# Patient Record
Sex: Male | Born: 1990 | Hispanic: Yes | Marital: Single | Smoking: Never smoker
Health system: Southern US, Community
[De-identification: ages and names within clinical notes are randomized; demographics above are authoritative.]

## PROBLEM LIST (undated history)

## (undated) DIAGNOSIS — F419 Anxiety disorder, unspecified: Secondary | ICD-10-CM

## (undated) HISTORY — DX: Anxiety disorder, unspecified: F41.9

---

## 2012-11-17 ENCOUNTER — Inpatient Hospital Stay (HOSPITAL_COMMUNITY)
Admission: EM | Admit: 2012-11-17 | Discharge: 2012-11-18 | DRG: 088 | Disposition: A | Discharge status: Home / Self Care | Payer: Self-pay | Attending: General Surgery | Admitting: General Surgery

## 2012-11-17 ENCOUNTER — Encounter (HOSPITAL_COMMUNITY): Payer: Self-pay | Admitting: Radiology

## 2012-11-17 ENCOUNTER — Emergency Department (HOSPITAL_COMMUNITY): Payer: Self-pay

## 2012-11-17 DIAGNOSIS — E876 Hypokalemia: Secondary | ICD-10-CM | POA: Diagnosis present

## 2012-11-17 DIAGNOSIS — R4182 Altered mental status, unspecified: Secondary | ICD-10-CM

## 2012-11-17 DIAGNOSIS — F10929 Alcohol use, unspecified with intoxication, unspecified: Secondary | ICD-10-CM | POA: Diagnosis present

## 2012-11-17 DIAGNOSIS — S060X9A Concussion with loss of consciousness of unspecified duration, initial encounter: Secondary | ICD-10-CM

## 2012-11-17 DIAGNOSIS — S069X0A Unspecified intracranial injury without loss of consciousness, initial encounter: Secondary | ICD-10-CM

## 2012-11-17 DIAGNOSIS — S0100XA Unspecified open wound of scalp, initial encounter: Secondary | ICD-10-CM | POA: Diagnosis present

## 2012-11-17 DIAGNOSIS — S060X0A Concussion without loss of consciousness, initial encounter: Principal | ICD-10-CM | POA: Diagnosis present

## 2012-11-17 DIAGNOSIS — F1010 Alcohol abuse, uncomplicated: Secondary | ICD-10-CM | POA: Diagnosis present

## 2012-11-17 DIAGNOSIS — S0003XA Contusion of scalp, initial encounter: Secondary | ICD-10-CM | POA: Diagnosis present

## 2012-11-17 DIAGNOSIS — S0101XA Laceration without foreign body of scalp, initial encounter: Secondary | ICD-10-CM

## 2012-11-17 DIAGNOSIS — S069X9A Unspecified intracranial injury with loss of consciousness of unspecified duration, initial encounter: Secondary | ICD-10-CM

## 2012-11-17 DIAGNOSIS — J9600 Acute respiratory failure, unspecified whether with hypoxia or hypercapnia: Secondary | ICD-10-CM | POA: Diagnosis present

## 2012-11-17 LAB — PROTIME-INR
INR: 1.17 (ref 0.00–1.49)
Prothrombin Time: 14.7 seconds (ref 11.6–15.2)

## 2012-11-17 LAB — CBC
HCT: 43.4 % (ref 39.0–52.0)
HCT: 38.3 % — ABNORMAL LOW (ref 39.0–52.0)
MCH: 31.1 pg (ref 26.0–34.0)
MCHC: 34.8 g/dL (ref 30.0–36.0)
MCHC: 34.5 g/dL (ref 30.0–36.0)
Platelets: 211 10*3/uL (ref 150–400)
Platelets: 186 10*3/uL (ref 150–400)
RDW: 13.1 % (ref 11.5–15.5)
RDW: 13.1 % (ref 11.5–15.5)
WBC: 11.9 10*3/uL — ABNORMAL HIGH (ref 4.0–10.5)

## 2012-11-17 LAB — BASIC METABOLIC PANEL
BUN: 8 mg/dL (ref 6–23)
Creatinine, Ser: 0.61 mg/dL (ref 0.50–1.35)
GFR calc Af Amer: >90 mL/min (ref >90)
GFR calc non Af Amer: >90 mL/min (ref >90)
Potassium: 3.1 mEq/L — ABNORMAL LOW (ref 3.5–5.1)

## 2012-11-17 LAB — POCT I-STAT, CHEM 8
Chloride: 102 mEq/L (ref 96–112)
Creatinine, Ser: 1.3 mg/dL (ref 0.50–1.35)
Glucose, Bld: 97 mg/dL (ref 70–99)
HCT: 47 % (ref 39.0–52.0)
Hemoglobin: 16 g/dL (ref 13.0–17.0)
Potassium: 3.1 mEq/L — ABNORMAL LOW (ref 3.5–5.1)
Sodium: 143 mEq/L (ref 135–145)
TCO2: 22 mmol/L (ref 0–100)

## 2012-11-17 LAB — POCT I-STAT 3, ART BLOOD GAS (G3+)
Acid-base deficit: 3 mmol/L — ABNORMAL HIGH (ref 0.0–2.0)
O2 Saturation: 100 %
pO2, Arterial: 561 mmHg — ABNORMAL HIGH (ref 80.0–100.0)

## 2012-11-17 LAB — COMPREHENSIVE METABOLIC PANEL
ALT: 32 U/L (ref 0–53)
Alkaline Phosphatase: 84 U/L (ref 39–117)
CO2: 24 mEq/L (ref 19–32)
Chloride: 101 mEq/L (ref 96–112)
Creatinine, Ser: 0.76 mg/dL (ref 0.50–1.35)
GFR calc Af Amer: >90 mL/min (ref >90)
GFR calc non Af Amer: >90 mL/min (ref >90)
Glucose, Bld: 104 mg/dL — ABNORMAL HIGH (ref 70–99)
Potassium: 3 mEq/L — ABNORMAL LOW (ref 3.5–5.1)
Sodium: 138 mEq/L (ref 135–145)
Total Bilirubin: 0.4 mg/dL (ref 0.3–1.2)

## 2012-11-17 LAB — RAPID URINE DRUG SCREEN, HOSP PERFORMED
Amphetamines: NOT DETECTED
Barbiturates: NOT DETECTED
Benzodiazepines: NOT DETECTED
Opiates: NOT DETECTED
Tetrahydrocannabinol: NOT DETECTED

## 2012-11-17 LAB — MRSA PCR SCREENING: MRSA by PCR: NEGATIVE

## 2012-11-17 MED ORDER — POTASSIUM CHLORIDE IN NACL 20-0.9 MEQ/L-% IV SOLN
INTRAVENOUS | Status: DC
  Administered 2012-11-17: 100 mL/h via INTRAVENOUS
  Filled 2012-11-17 (×5): qty 1000

## 2012-11-17 MED ORDER — DIAZEPAM 5 MG/ML IJ SOLN
5.0000 mg | Freq: Once | INTRAMUSCULAR | Status: AC
  Administered 2012-11-17: 5 mg via INTRAVENOUS

## 2012-11-17 MED ORDER — PROPOFOL 10 MG/ML IV EMUL
5.0000 ug/kg/min | Freq: Once | INTRAVENOUS | Status: AC
  Administered 2012-11-17 (×2): 10 ug/kg/min via INTRAVENOUS

## 2012-11-17 MED ORDER — THIAMINE HCL 100 MG/ML IJ SOLN
100.0000 mg | Freq: Once | INTRAMUSCULAR | Status: AC
  Administered 2012-11-17: 100 mg via INTRAVENOUS
  Filled 2012-11-17: qty 2

## 2012-11-17 MED ORDER — PANTOPRAZOLE SODIUM 40 MG PO TBEC
40.0000 mg | DELAYED_RELEASE_TABLET | Freq: Every day | ORAL | Status: DC
  Administered 2012-11-18: 40 mg via ORAL
  Filled 2012-11-17: qty 1

## 2012-11-17 MED ORDER — ONDANSETRON HCL 4 MG/2ML IJ SOLN
4.0000 mg | Freq: Four times a day (QID) | INTRAMUSCULAR | Status: DC | PRN
  Administered 2012-11-17: 4 mg via INTRAVENOUS
  Filled 2012-11-17: qty 2

## 2012-11-17 MED ORDER — CHLORHEXIDINE GLUCONATE 0.12 % MT SOLN
15.0000 mL | Freq: Two times a day (BID) | OROMUCOSAL | Status: DC
  Administered 2012-11-17 – 2012-11-18 (×3): 15 mL via OROMUCOSAL
  Filled 2012-11-17 (×3): qty 15

## 2012-11-17 MED ORDER — SODIUM CHLORIDE 0.9 % IV SOLN
40.0000 ug/h | INTRAVENOUS | Status: DC
  Administered 2012-11-17: 40 ug/h via INTRAVENOUS
  Filled 2012-11-17: qty 50

## 2012-11-17 MED ORDER — ONDANSETRON HCL 4 MG PO TABS: 4.0000 mg | ORAL_TABLET | Freq: Four times a day (QID) | ORAL | Status: DC | PRN

## 2012-11-17 MED ORDER — IOHEXOL 300 MG/ML  SOLN
100.0000 mL | Freq: Once | INTRAMUSCULAR | Status: AC | PRN
  Administered 2012-11-17: 100 mL via INTRAVENOUS

## 2012-11-17 MED ORDER — POTASSIUM CHLORIDE 10 MEQ/100ML IV SOLN
10.0000 meq | INTRAVENOUS | Status: AC
  Administered 2012-11-17 (×3): 10 meq via INTRAVENOUS
  Filled 2012-11-17 (×3): qty 100

## 2012-11-17 MED ORDER — ETOMIDATE 2 MG/ML IV SOLN
INTRAVENOUS | Status: AC | PRN
  Administered 2012-11-17: 20 mg via INTRAVENOUS

## 2012-11-17 MED ORDER — SUCCINYLCHOLINE CHLORIDE 20 MG/ML IJ SOLN
INTRAMUSCULAR | Status: AC | PRN
  Administered 2012-11-17: 120 mg via INTRAVENOUS

## 2012-11-17 MED ORDER — SODIUM CHLORIDE 0.9 % IV SOLN
2.0000 mg/h | INTRAVENOUS | Status: DC
  Administered 2012-11-17: 10 mg/h via INTRAVENOUS
  Administered 2012-11-17: 2 mg/h via INTRAVENOUS
  Filled 2012-11-17 (×2): qty 10

## 2012-11-17 MED ORDER — BIOTENE DRY MOUTH MT LIQD
15.0000 mL | Freq: Four times a day (QID) | OROMUCOSAL | Status: DC
  Administered 2012-11-17 – 2012-11-18 (×5): 15 mL via OROMUCOSAL

## 2012-11-17 MED ORDER — METOPROLOL TARTRATE 1 MG/ML IV SOLN: 5.0000 mg | INTRAVENOUS | Status: DC | PRN

## 2012-11-17 MED ORDER — THIAMINE HCL 100 MG/ML IJ SOLN
Freq: Once | INTRAVENOUS | Status: AC
  Administered 2012-11-17: 04:00:00 via INTRAVENOUS
  Filled 2012-11-17: qty 1000

## 2012-11-17 MED ORDER — MIDAZOLAM HCL 5 MG/5ML IJ SOLN: INTRAMUSCULAR | Status: AC | PRN

## 2012-11-17 MED ORDER — SODIUM CHLORIDE 0.9 % IV SOLN: Freq: Once | INTRAVENOUS | Status: DC

## 2012-11-17 MED ORDER — OXYCODONE HCL 5 MG PO TABS
5.0000 mg | ORAL_TABLET | ORAL | Status: DC | PRN
  Filled 2012-11-17: qty 1

## 2012-11-17 MED ORDER — PANTOPRAZOLE SODIUM 40 MG IV SOLR
40.0000 mg | Freq: Every day | INTRAVENOUS | Status: DC
  Administered 2012-11-17: 40 mg via INTRAVENOUS
  Filled 2012-11-17 (×2): qty 40
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
## 2012-11-17 NOTE — Progress Notes (Signed)
UR completed.  Jaryn Hocutt, RN BSN M[facility] CCM Trauma/Neuro ICU Case Manager [phone]  
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
## 2012-11-17 NOTE — Progress Notes (Signed)
Patient transported on vent to room 3M11C without incident.

## 2012-11-17 NOTE — Procedures (Signed)
Extubation Procedure Note  Patient Details:   Name: [name] DOB: 04/02/1990 MRN: [id number]   Airway Documentation:     Evaluation  O2 sats: stable throughout Complications: No apparent complications Patient did tolerate procedure well. Bilateral Breath Sounds: Clear   Yes  Kirandeep Fariss M 11/17/2012, 11:50 AM  Pt extubated, breath sounds clear. No stridor. Pt able to vocalize name and date. Vitals are stable. Oxygen 2 LPM saturation 100%.

## 2012-11-17 NOTE — Progress Notes (Signed)
Patient transported on ventilator to CT and returned without incident.
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
## 2012-11-17 NOTE — ED Notes (Signed)
Patient to CT. Greig Castilla, RN and Dr. Andrey Campanile at bedside.
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
## 2012-11-17 NOTE — ED Notes (Signed)
Patient arrives via [facility], Dr. Andrey Campanile and Dr. Arnoldo Morale at bedside.
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
## 2012-11-18 ENCOUNTER — Encounter (HOSPITAL_COMMUNITY): Payer: Self-pay

## 2012-11-18 DIAGNOSIS — S060XAA Concussion with loss of consciousness status unknown, initial encounter: Secondary | ICD-10-CM

## 2012-11-18 DIAGNOSIS — S060X9A Concussion with loss of consciousness of unspecified duration, initial encounter: Secondary | ICD-10-CM

## 2012-11-18 DIAGNOSIS — S0101XA Laceration without foreign body of scalp, initial encounter: Secondary | ICD-10-CM

## 2012-11-18 DIAGNOSIS — F10929 Alcohol use, unspecified with intoxication, unspecified: Secondary | ICD-10-CM | POA: Diagnosis present

## 2012-11-18 DIAGNOSIS — J9600 Acute respiratory failure, unspecified whether with hypoxia or hypercapnia: Secondary | ICD-10-CM | POA: Diagnosis present

## 2012-11-18 LAB — BASIC METABOLIC PANEL
BUN: 6 mg/dL (ref 6–23)
CO2: 20 mEq/L (ref 19–32)
Calcium: 8.8 mg/dL (ref 8.4–10.5)
GFR calc non Af Amer: >90 mL/min (ref >90)
Glucose, Bld: 85 mg/dL (ref 70–99)
Potassium: 3.8 mEq/L (ref 3.5–5.1)
Sodium: 136 mEq/L (ref 135–145)

## 2012-11-18 LAB — SAMPLE TO BLOOD BANK

## 2012-11-18 LAB — URINE CULTURE
Colony Count: NO GROWTH
Culture: NO GROWTH

## 2012-11-18 LAB — CBC
Hemoglobin: 13.1 g/dL (ref 13.0–17.0)
MCH: 29.8 pg (ref 26.0–34.0)
MCHC: 33.2 g/dL (ref 30.0–36.0)
Platelets: 162 10*3/uL (ref 150–400)
RBC: 4.4 MIL/uL (ref 4.22–5.81)
WBC: 9.9 10*3/uL (ref 4.0–10.5)

## 2012-11-18 MED ORDER — TRAMADOL HCL 50 MG PO TABS
50.0000 mg | ORAL_TABLET | Freq: Four times a day (QID) | ORAL | Status: DC | PRN
  Administered 2012-11-18: 50 mg via ORAL
  Filled 2012-11-18: qty 1

## 2012-11-18 MED ORDER — TRAMADOL HCL 50 MG PO TABS: 50.0000 mg | ORAL_TABLET | Freq: Four times a day (QID) | ORAL | Status: AC | PRN

## 2012-11-18 NOTE — Progress Notes (Signed)
Pt discharged to home with sister via wheelchair.  D/C instructions were given including RX.  No needs at this time.  Harlan Stains Maloy

## 2012-11-18 NOTE — Progress Notes (Signed)
Mild headache.  Should be able to go home today.  This patient has been seen and I agree with the findings and treatment plan.  Marta Lamas. Gae Bon, MD, FACS [phone] (pager) [phone] (direct pager) Trauma Surgeon
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
## 2012-11-18 NOTE — Progress Notes (Signed)
Versed 30 mg or 30 ml and Fentanyl 2250 mcg or wasted in sink. Wasting of both medications witnessed by Debbie G.,RN.
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
## 2012-11-25 ENCOUNTER — Ambulatory Visit (INDEPENDENT_AMBULATORY_CARE_PROVIDER_SITE_OTHER): Payer: Self-pay | Admitting: Orthopedic Surgery

## 2012-11-25 ENCOUNTER — Encounter (INDEPENDENT_AMBULATORY_CARE_PROVIDER_SITE_OTHER): Payer: Self-pay

## 2012-11-25 VITALS — BP 112/70 | HR 76 | Temp 98.6°F | Resp 15 | Ht 68.25 in | Wt 124.4 lb

## 2012-11-25 DIAGNOSIS — S0101XD Laceration without foreign body of scalp, subsequent encounter: Secondary | ICD-10-CM

## 2012-11-25 DIAGNOSIS — S060X9D Concussion with loss of consciousness of unspecified duration, subsequent encounter: Secondary | ICD-10-CM

## 2012-11-25 DIAGNOSIS — Z5189 Encounter for other specified aftercare: Secondary | ICD-10-CM
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
## 2014-07-31 IMAGING — CT CT HEAD W/O CM
3 of 5 series · 14 of 47 positions shown, 16 images · non-contrast
Comparison: None available at time of study interpretation.

CT HEAD

CLINICAL DATA: Level I trauma.

CT HEAD WITHOUT CONTRAST
CT CERVICAL SPINE WITHOUT CONTRAST
TECHNIQUE: Multidetector CT imaging of the head and cervical spine
was performed following the standard protocol without intravenous
contrast.  Multiplanar CT image reconstructions of the cervical
spine were also generated.

[Series 7: coronals · coronal · 0.37mm/px · 3 of 61 slices shown]
[im 21/61  brain]
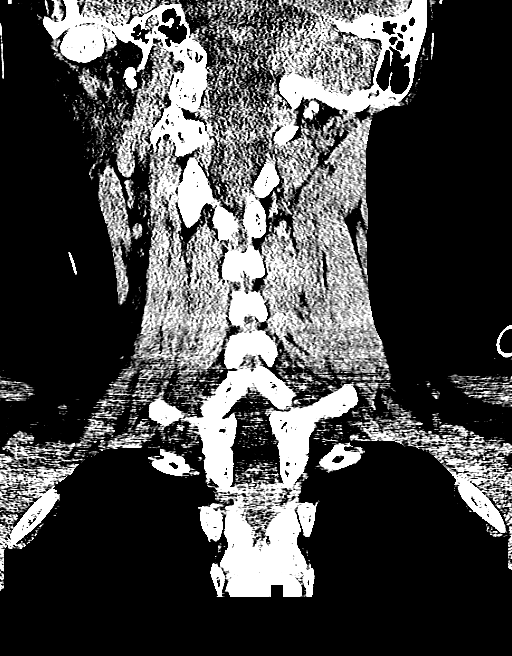
[im 27/61  brain]
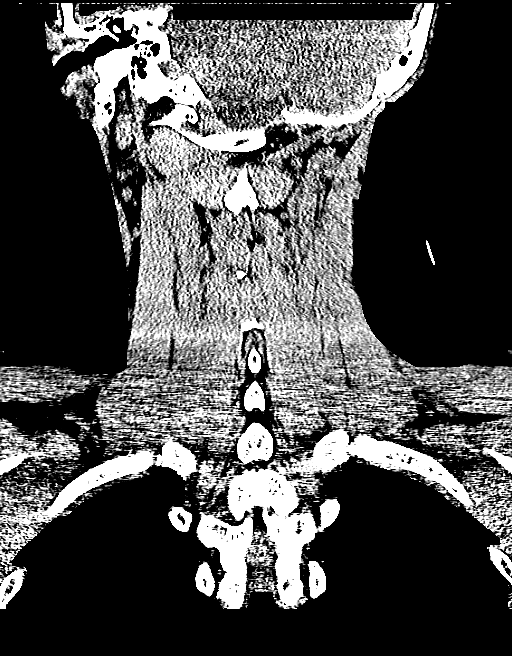
[im 34/61  brain]
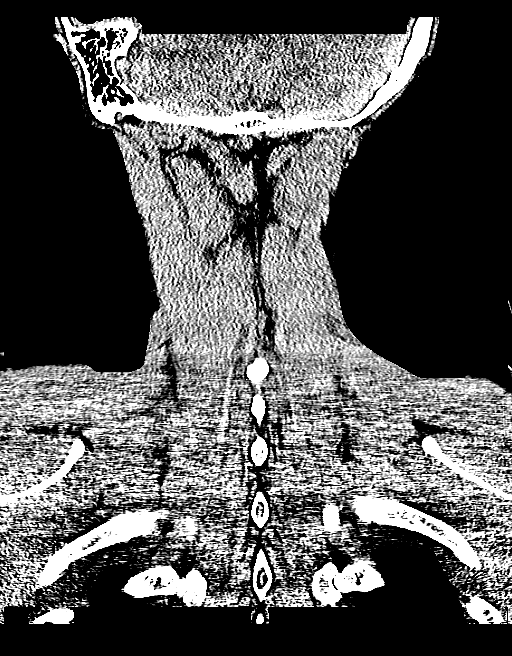

[Series 8: sagittals · sagittal · 0.34mm/px · 3 of 61 slices shown]
[im 21/61  brain]
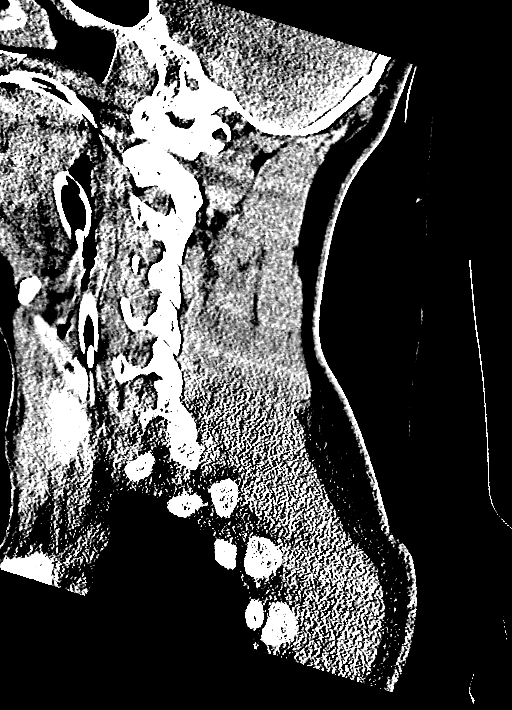
[im 31/61  brain]
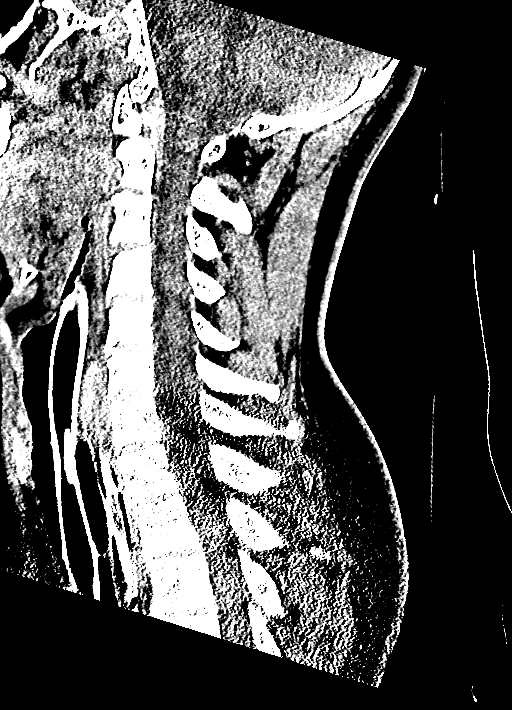
[im 41/61  brain]
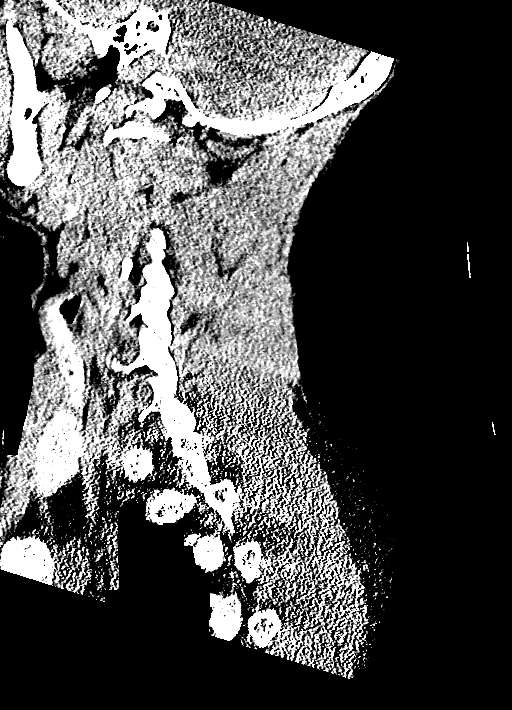

[Series 9: orthogonals · axial · 0.20mm/px · z∈[-64,+93]mm · 8 of 102 slices shown, 10 images]
[im 9/102  brain]
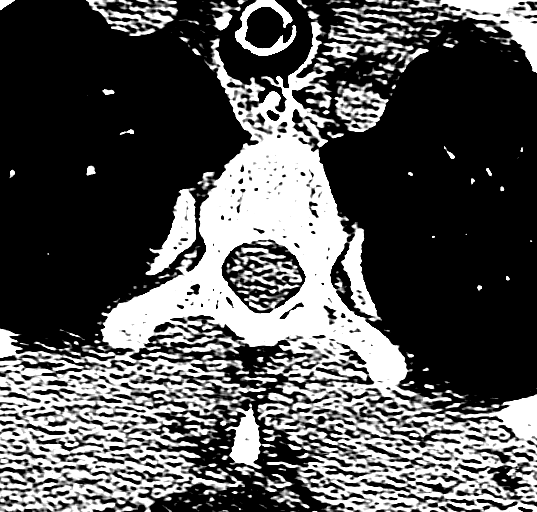
[im 9/102  bone]
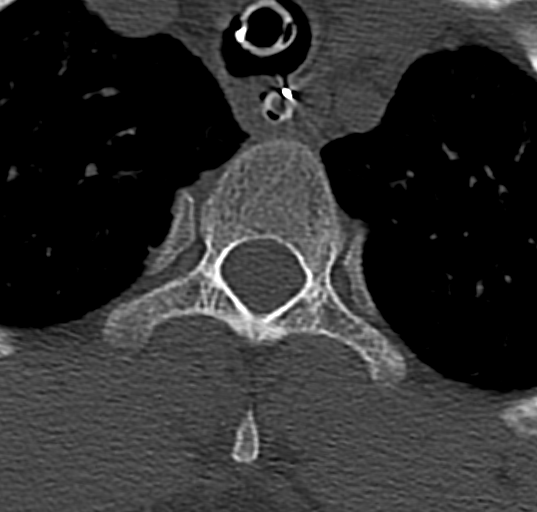
[im 26/102  brain]
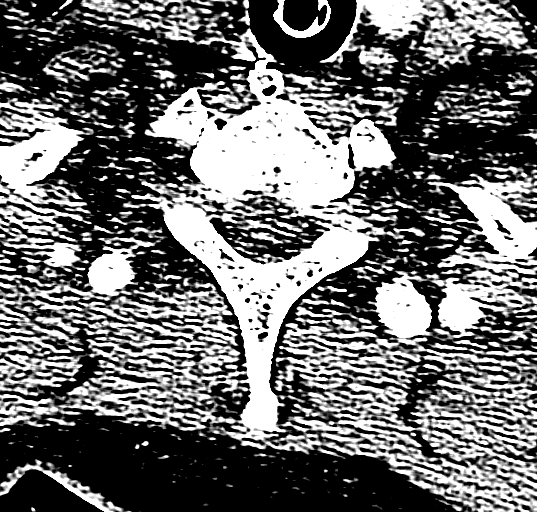
[im 34/102  brain]
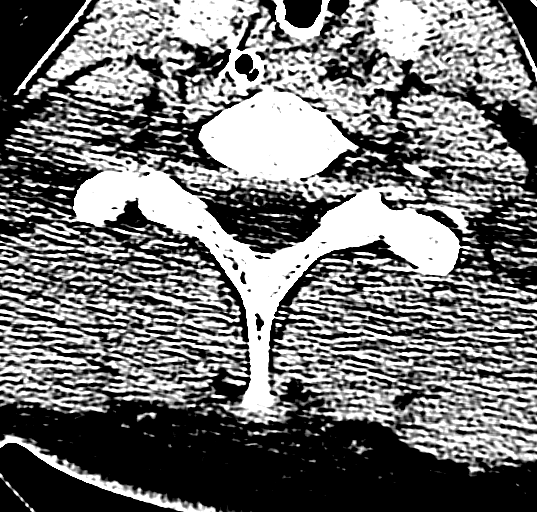
[im 43/102  brain]
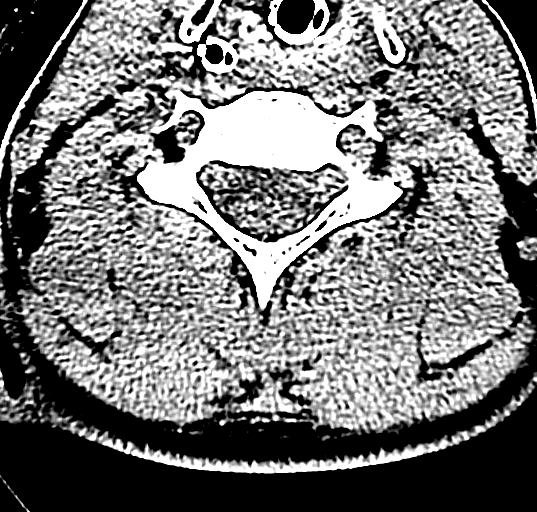
[im 59/102  brain]
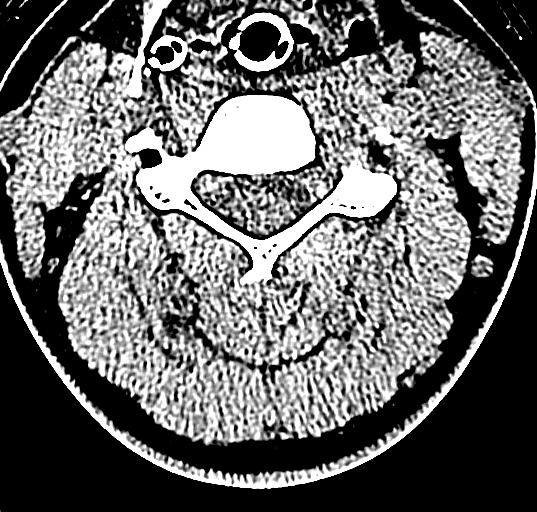
[im 59/102  bone]
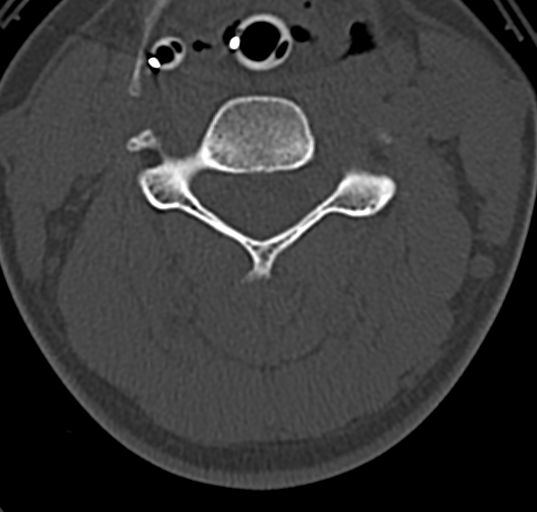
[im 68/102  brain]
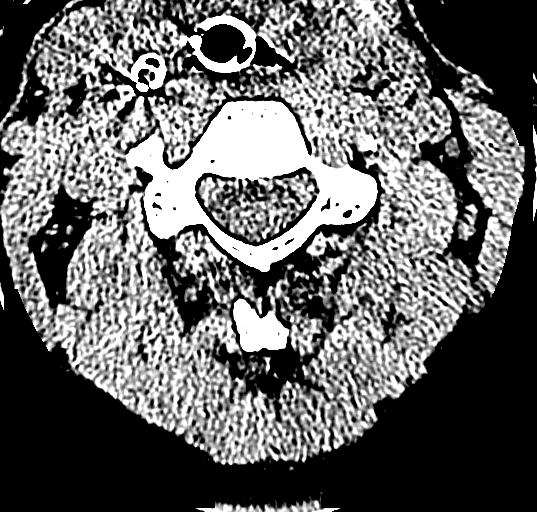
[im 76/102  brain]
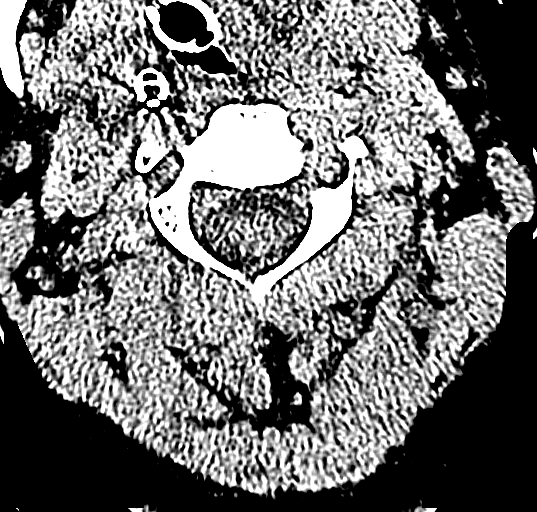
[im 93/102  brain]
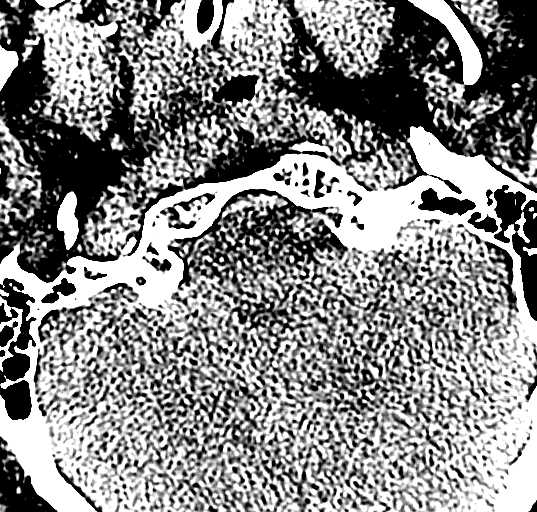

[14 of 47 positions shown; findings below may reference images not displayed]

FINDINGS: The ventricles and sulci are normal.  No intraparenchymal
hemorrhage, mass effect nor midline shift.  No acute large vascular
territory infarcts.

No abnormal extra-axial fluid collections.  Basal cisterns are
patent.

Small to moderate right frontal toe parietal scalp hematoma with
bubbles of gas consistent with laceration, overlying skin staples
but, no radiopaque foreign bodies.  Visualized paranasal sinuses
and mastoid aircells are well-aerated.  The included ocular globes
and orbital contents are non-suspicious.
IMPRESSION: Small to moderate right frontal parietal scalp hematoma without
underlying skull fracture and, no acute intracranial process;
normal noncontrast CT of the head.

CT CERVICAL SPINE
FINDINGS: Cervical vertebral bodies and posterior elements are
intact and aligned with maintenance of the cervical lordosis.
Intervertebral disc heights preserved.  No destructive bony
lesions.  Endotracheal tube, oral gastric tube in place, distal
tips not imaged.  Paraspinal soft tissues are not suspicious.

No osseous canal stenosis, nor neural foraminal narrowing.
IMPRESSION: No acute fracture or malalignment; normal noncontrast CT of the
cervical spine.  Life support lines in place.

## 2022-09-25 ENCOUNTER — Ambulatory Visit (INDEPENDENT_AMBULATORY_CARE_PROVIDER_SITE_OTHER): Payer: Self-pay | Admitting: Family Medicine

## 2022-09-25 ENCOUNTER — Encounter: Payer: Self-pay | Admitting: Family Medicine

## 2022-09-25 VITALS — BP 117/79 | HR 93 | Ht 69.0 in | Wt 155.0 lb

## 2022-09-25 DIAGNOSIS — Z114 Encounter for screening for human immunodeficiency virus [HIV]: Secondary | ICD-10-CM

## 2022-09-25 DIAGNOSIS — G8929 Other chronic pain: Secondary | ICD-10-CM

## 2022-09-25 DIAGNOSIS — M545 Low back pain, unspecified: Secondary | ICD-10-CM

## 2022-09-25 DIAGNOSIS — Z13220 Encounter for screening for lipoid disorders: Secondary | ICD-10-CM

## 2022-09-25 DIAGNOSIS — M549 Dorsalgia, unspecified: Secondary | ICD-10-CM | POA: Insufficient documentation

## 2022-09-25 DIAGNOSIS — Z1159 Encounter for screening for other viral diseases: Secondary | ICD-10-CM

## 2022-09-25 DIAGNOSIS — E559 Vitamin D deficiency, unspecified: Secondary | ICD-10-CM

## 2022-09-25 DIAGNOSIS — E538 Deficiency of other specified B group vitamins: Secondary | ICD-10-CM

## 2022-09-25 DIAGNOSIS — Z131 Encounter for screening for diabetes mellitus: Secondary | ICD-10-CM

## 2022-09-25 DIAGNOSIS — Z1329 Encounter for screening for other suspected endocrine disorder: Secondary | ICD-10-CM

## 2022-09-25 MED ORDER — METHOCARBAMOL 500 MG PO TABS: 500.0000 mg | ORAL_TABLET | Freq: Two times a day (BID) | ORAL | 3 refills | Status: AC | PRN

## 2022-09-25 NOTE — Assessment & Plan Note (Signed)
Robaxin 500 PRN Discussed medication desired effects, potential side effects. Non pharmacological interventions include rest, avoid twisting,improper bending, straining lower back. Demonstration of proper body mechanics. Alternate ice and heat. Recommend stretching back and legs. Follow up for worsening or persistent symptoms. Patient verbalizes understanding regarding plan of care and all questions answered.

## 2022-09-25 NOTE — Patient Instructions (Signed)

## 2022-09-25 NOTE — Progress Notes (Signed)
New Patient Office Visit   Subjective   Patient ID: Zachary Porter, male    DOB: 11-23-1990  Age: 32 y.o. MRN: [id number]  CC:  Chief Complaint  Patient presents with   Establish Care    Looking to start a wellness check.     HPI Zachary Porter 32 year old male, presents to establish care. He  has a past medical history of Anxiety.  Back Pain This is a new problem. The current episode started more than 1 month ago. The problem occurs constantly. The problem has been gradually worsening since onset. The pain is present in the lumbar spine. The quality of the pain is described as aching. The pain does not radiate. The pain is at a severity of 7/10. The pain is The same all the time. The symptoms are aggravated by lying down, position and standing. Pertinent negatives include no abdominal pain, chest pain, dysuria, leg pain, numbness or tingling. Risk factors include sedentary lifestyle and lack of exercise. He has tried analgesics for the symptoms. The treatment provided mild relief.      Outpatient Encounter Medications as of 09/25/2022  Medication Sig   methocarbamol (ROBAXIN) 500 MG tablet Take 1 tablet (500 mg total) by mouth every 12 (twelve) hours as needed for muscle spasms.   traMADol (ULTRAM) 50 MG tablet Take 1-2 tablets (50-100 mg total) by mouth every 6 (six) hours as needed (50mg  for mild pain, 75mg  for moderate pain, 100mg  for severe pain).   No facility-administered encounter medications on file as of 09/25/2022.    History reviewed. No pertinent surgical history.  Review of Systems  HENT:  Positive for tinnitus.   Eyes:  Negative for blurred vision.  Respiratory:  Negative for shortness of breath.   Cardiovascular:  Negative for chest pain.  Gastrointestinal:  Negative for abdominal pain.  Genitourinary:  Negative for dysuria.  Musculoskeletal:  Positive for back pain and myalgias.  Skin:  Negative for rash.  Neurological:  Negative for  dizziness, tingling and numbness.  Psychiatric/Behavioral:  Negative for depression. The patient is not nervous/anxious.       Objective    BP 117/79 (BP Location: Right Arm, Patient Position: Sitting, Cuff Size: Normal)   Pulse 93   Ht 5\' 9"  (1.753 m)   Wt 155 lb 0.6 oz (70.3 kg)   SpO2 97%   BMI 22.90 kg/m   Physical Exam Vitals reviewed.  Constitutional:      General: He is not in acute distress.    Appearance: Normal appearance. He is not ill-appearing, toxic-appearing or diaphoretic.  HENT:     Head: Normocephalic.  Eyes:     General:        Right eye: No discharge.        Left eye: No discharge.     Conjunctiva/sclera: Conjunctivae normal.  Cardiovascular:     Rate and Rhythm: Normal rate.     Pulses: Normal pulses.     Heart sounds: Normal heart sounds.  Pulmonary:     Effort: Pulmonary effort is normal. No respiratory distress.     Breath sounds: Normal breath sounds.  Abdominal:     General: Bowel sounds are normal.     Palpations: Abdomen is soft.     Tenderness: There is no abdominal tenderness. There is no right CVA tenderness, left CVA tenderness or guarding.  Musculoskeletal:     Cervical back: Normal range of motion.     Lumbar back: Decreased range  of motion. Negative right straight leg raise test and negative left straight leg raise test.  Skin:    General: Skin is warm and dry.     Capillary Refill: Capillary refill takes less than 2 seconds.  Neurological:     General: No focal deficit present.     Mental Status: He is alert and oriented to person, place, and time.     Coordination: Coordination normal.     Gait: Gait normal.  Psychiatric:        Mood and Affect: Mood normal.        Behavior: Behavior normal.        Thought Content: Thought content normal.        Judgment: Judgment normal.       Assessment & Plan:  Screening for diabetes mellitus -     Microalbumin / creatinine urine ratio -     Hemoglobin A1c  Screening for lipid  disorders -     Lipid panel -     CMP14+EGFR -     CBC with Differential/Platelet  Need for hepatitis C screening test -     Hepatitis C antibody  Vitamin D deficiency -     VITAMIN D 25 Hydroxy (Vit-D Deficiency, Fractures)  Screening for HIV (human immunodeficiency virus) -     HIV Antibody (routine testing w rflx)  Screening for thyroid disorder -     TSH + free T4  Vitamin B12 deficiency -     B12 and Folate Panel  Chronic low back pain without sciatica, unspecified back pain laterality Assessment & Plan: Robaxin 500 PRN Discussed medication desired effects, potential side effects. Non pharmacological interventions include rest, avoid twisting,improper bending, straining lower back. Demonstration of proper body mechanics. Alternate ice and heat. Recommend stretching back and legs. Follow up for worsening or persistent symptoms. Patient verbalizes understanding regarding plan of care and all questions answered.    Other orders -     Methocarbamol; Take 1 tablet (500 mg total) by mouth every 12 (twelve) hours as needed for muscle spasms.  Dispense: 30 tablet; Refill: 3    Return in 1 year (on 09/25/2023), or if symptoms worsen or fail to improve, for Annual Physical.   Cruzita Lederer Newman Nip, FNP

## 2022-09-28 LAB — CBC WITH DIFFERENTIAL/PLATELET
Basophils Absolute: 0.1 10*3/uL (ref 0.0–0.2)
Basos: 1 %
EOS (ABSOLUTE): 0.3 10*3/uL (ref 0.0–0.4)
Eos: 4 %
Hematocrit: 44 % (ref 37.5–51.0)
Hemoglobin: 14.7 g/dL (ref 13.0–17.7)
Immature Grans (Abs): 0 10*3/uL (ref 0.0–0.1)
Immature Granulocytes: 0 %
Lymphocytes Absolute: 2.5 10*3/uL (ref 0.7–3.1)
Lymphs: 39 %
MCH: 30.2 pg (ref 26.6–33.0)
MCHC: 33.4 g/dL (ref 31.5–35.7)
MCV: 91 fL (ref 79–97)
Monocytes Absolute: 0.4 10*3/uL (ref 0.1–0.9)
Monocytes: 6 %
Neutrophils Absolute: 3.2 10*3/uL (ref 1.4–7.0)
Neutrophils: 50 %
Platelets: 251 10*3/uL (ref 150–450)
RBC: 4.86 x10E6/uL (ref 4.14–5.80)
RDW: 12.9 % (ref 11.6–15.4)
WBC: 6.4 10*3/uL (ref 3.4–10.8)

## 2022-09-28 LAB — CMP14+EGFR
ALT: 29 IU/L (ref 0–44)
AST: 21 IU/L (ref 0–40)
Albumin: 4.8 g/dL (ref 4.1–5.1)
Alkaline Phosphatase: 98 IU/L (ref 44–121)
BUN/Creatinine Ratio: 11 (ref 9–20)
BUN: 10 mg/dL (ref 6–20)
Bilirubin Total: 0.5 mg/dL (ref 0.0–1.2)
CO2: 20 mmol/L (ref 20–29)
Calcium: 9.5 mg/dL (ref 8.7–10.2)
Chloride: 104 mmol/L (ref 96–106)
Creatinine, Ser: 0.92 mg/dL (ref 0.76–1.27)
Globulin, Total: 2.5 g/dL (ref 1.5–4.5)
Glucose: 100 mg/dL — ABNORMAL HIGH (ref 70–99)
Potassium: 4.2 mmol/L (ref 3.5–5.2)
Sodium: 140 mmol/L (ref 134–144)
Total Protein: 7.3 g/dL (ref 6.0–8.5)
eGFR: 112 mL/min/{1.73_m2} (ref >59)

## 2022-09-28 LAB — B12 AND FOLATE PANEL
Folate: 6.9 ng/mL (ref >3.0)
Vitamin B-12: 354 pg/mL (ref 232–1245)

## 2022-09-28 LAB — HEMOGLOBIN A1C
Est. average glucose Bld gHb Est-mCnc: 105 mg/dL
Hgb A1c MFr Bld: 5.3 % (ref 4.8–5.6)

## 2022-09-28 LAB — LIPID PANEL
Chol/HDL Ratio: 4.2 ratio (ref 0.0–5.0)
Cholesterol, Total: 150 mg/dL (ref 100–199)
HDL: 36 mg/dL — ABNORMAL LOW (ref >39)
LDL Chol Calc (NIH): 71 mg/dL (ref 0–99)
Triglycerides: 268 mg/dL — ABNORMAL HIGH (ref 0–149)
VLDL Cholesterol Cal: 43 mg/dL — ABNORMAL HIGH (ref 5–40)

## 2022-09-28 LAB — MICROALBUMIN / CREATININE URINE RATIO
Creatinine, Urine: 220.4 mg/dL
Microalb/Creat Ratio: 4 mg/g{creat} (ref 0–29)
Microalbumin, Urine: 7.8 ug/mL

## 2022-09-28 LAB — HEPATITIS C ANTIBODY: Hep C Virus Ab: NONREACTIVE

## 2022-09-28 LAB — TSH+FREE T4
Free T4: 1.29 ng/dL (ref 0.82–1.77)
TSH: 2.65 u[IU]/mL (ref 0.450–4.500)

## 2022-09-28 LAB — VITAMIN D 25 HYDROXY (VIT D DEFICIENCY, FRACTURES): Vit D, 25-Hydroxy: 15 ng/mL — ABNORMAL LOW (ref 30.0–100.0)

## 2022-09-28 LAB — HIV ANTIBODY (ROUTINE TESTING W REFLEX): HIV Screen 4th Generation wRfx: NONREACTIVE

## 2022-09-29 NOTE — Progress Notes (Signed)
Please inform patient  Vitamin D levels low, I advise to taking  over the counter supplements of vitamin D 1000 IU/day to prevent low vitamin D levels. Consuming Vitamin D rich food sources include fish, salmon, sardines, egg yolks, red meat, liver, oranges, soy milk.      Triglycerides levels elevated, start lifestyle modifications follow diet low in saturated fat, reduce dietary salt intake, avoid fatty foods, maintain an exercise routine 3 to 5 days a week for a minimum total of 150 minutes.   I encourage over the counter omega-3 fish oil 1000 mg twice daily.
